# Patient Record
Sex: Female | Born: 1955 | Race: Black or African American | Hispanic: No | Smoking: Former smoker
Health system: Southern US, Community
[De-identification: ages and names within clinical notes are randomized; demographics above are authoritative.]

## PROBLEM LIST (undated history)

## (undated) DIAGNOSIS — Z87891 Personal history of nicotine dependence: Secondary | ICD-10-CM

## (undated) DIAGNOSIS — M1990 Unspecified osteoarthritis, unspecified site: Secondary | ICD-10-CM

## (undated) DIAGNOSIS — G4700 Insomnia, unspecified: Secondary | ICD-10-CM

## (undated) DIAGNOSIS — D72820 Lymphocytosis (symptomatic): Secondary | ICD-10-CM

## (undated) DIAGNOSIS — F329 Major depressive disorder, single episode, unspecified: Secondary | ICD-10-CM

## (undated) DIAGNOSIS — M170 Bilateral primary osteoarthritis of knee: Secondary | ICD-10-CM

## (undated) DIAGNOSIS — I350 Nonrheumatic aortic (valve) stenosis: Secondary | ICD-10-CM

## (undated) DIAGNOSIS — E782 Mixed hyperlipidemia: Secondary | ICD-10-CM

## (undated) DIAGNOSIS — M5136 Other intervertebral disc degeneration, lumbar region: Secondary | ICD-10-CM

## (undated) DIAGNOSIS — I700 Atherosclerosis of aorta: Secondary | ICD-10-CM

## (undated) DIAGNOSIS — R7309 Other abnormal glucose: Secondary | ICD-10-CM

## (undated) DIAGNOSIS — M545 Low back pain, unspecified: Secondary | ICD-10-CM

## (undated) DIAGNOSIS — F411 Generalized anxiety disorder: Secondary | ICD-10-CM

## (undated) DIAGNOSIS — G96198 Other disorders of meninges, not elsewhere classified: Secondary | ICD-10-CM

## (undated) DIAGNOSIS — M25552 Pain in left hip: Secondary | ICD-10-CM

## (undated) DIAGNOSIS — M51369 Other intervertebral disc degeneration, lumbar region without mention of lumbar back pain or lower extremity pain: Secondary | ICD-10-CM

## (undated) DIAGNOSIS — F32A Depression, unspecified: Secondary | ICD-10-CM

## (undated) DIAGNOSIS — R42 Dizziness and giddiness: Secondary | ICD-10-CM

## (undated) DIAGNOSIS — I351 Nonrheumatic aortic (valve) insufficiency: Secondary | ICD-10-CM

## (undated) DIAGNOSIS — J309 Allergic rhinitis, unspecified: Secondary | ICD-10-CM

## (undated) DIAGNOSIS — I10 Essential (primary) hypertension: Secondary | ICD-10-CM

## (undated) DIAGNOSIS — M25512 Pain in left shoulder: Secondary | ICD-10-CM

## (undated) HISTORY — DX: Atherosclerosis of aorta: I70.0

## (undated) HISTORY — DX: Other abnormal glucose: R73.09

## (undated) HISTORY — DX: Personal history of nicotine dependence: Z87.891

## (undated) HISTORY — DX: Other disorders of meninges, not elsewhere classified: G96.198

## (undated) HISTORY — DX: Nonrheumatic aortic (valve) stenosis: I35.0

## (undated) HISTORY — DX: Unspecified osteoarthritis, unspecified site: M19.90

## (undated) HISTORY — DX: Pain in left hip: M25.552

## (undated) HISTORY — DX: Pain in left shoulder: M25.512

## (undated) HISTORY — DX: Nonrheumatic aortic (valve) insufficiency: I35.1

## (undated) HISTORY — PX: ECTOPIC PREGNANCY SURGERY: SHX613

## (undated) HISTORY — DX: Generalized anxiety disorder: F41.1

## (undated) HISTORY — DX: Dizziness and giddiness: R42

## (undated) HISTORY — DX: Low back pain, unspecified: M54.50

## (undated) HISTORY — DX: Essential (primary) hypertension: I10

## (undated) HISTORY — DX: Lymphocytosis (symptomatic): D72.820

## (undated) HISTORY — DX: Bilateral primary osteoarthritis of knee: M17.0

## (undated) HISTORY — DX: Mixed hyperlipidemia: E78.2

## (undated) HISTORY — PX: ABDOMINAL HYSTERECTOMY: SUR658

## (undated) HISTORY — DX: Allergic rhinitis, unspecified: J30.9

## (undated) HISTORY — DX: Other intervertebral disc degeneration, lumbar region without mention of lumbar back pain or lower extremity pain: M51.369

## (undated) HISTORY — PX: TUBAL LIGATION: SHX77

## (undated) HISTORY — DX: Depression, unspecified: F32.A

## (undated) HISTORY — DX: Insomnia, unspecified: G47.00

## (undated) HISTORY — DX: Other intervertebral disc degeneration, lumbar region: M51.36

---
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
## 2019-11-07 ENCOUNTER — Ambulatory Visit: Payer: Self-pay | Admitting: Cardiology

## 2019-11-07 NOTE — Progress Notes (Deleted)
Cardiology Office Consult Note    Date:  11/07/2019   ID:  Amanda Mcguire, DOB 1956/07/01, MRN [id number]  PCP:  No primary care provider on file.  Cardiologist:  Fransico Him, MD   No chief complaint on file.   History of Present Illness:  Amanda Mcguire is a 64 y.o. female who is being seen today for the evaluation of abnormal EKG at the request of No ref. provider found.  This is a 64yo female with     No past medical history on file.  *** The histories are not reviewed yet. Please review them in the "History" navigator section and refresh this [place].  Current Medications: No outpatient medications have been marked as taking for the 11/07/19 encounter (Appointment) with Sueanne Margarita, MD.    Allergies:   Patient has no allergy information on record.   Social History   Socioeconomic History  . Marital status: Not on file    Spouse name: Not on file  . Number of children: Not on file  . Years of education: Not on file  . Highest education level: Not on file  Occupational History  . Not on file  Tobacco Use  . Smoking status: Not on file  Substance and Sexual Activity  . Alcohol use: Not on file  . Drug use: Not on file  . Sexual activity: Not on file  Other Topics Concern  . Not on file  Social History Narrative  . Not on file   Social Determinants of Health   Financial Resource Strain:   . Difficulty of Paying Living Expenses: Not on file  Food Insecurity:   . Worried About Charity fundraiser in the Last Year: Not on file  . Ran Out of Food in the Last Year: Not on file  Transportation Needs:   . Lack of Transportation (Medical): Not on file  . Lack of Transportation (Non-Medical): Not on file  Physical Activity:   . Days of Exercise per Week: Not on file  . Minutes of Exercise per Session: Not on file  Stress:   . Feeling of Stress : Not on file  Social Connections:   . Frequency of Communication with Friends and Family: Not on file  .  Frequency of Social Gatherings with Friends and Family: Not on file  . Attends Religious Services: Not on file  . Active Member of Clubs or Organizations: Not on file  . Attends Archivist Meetings: Not on file  . Marital Status: Not on file     Family History:  The patient's ***family history is not on file.   ROS:   Please see the history of present illness.    ROS All other systems reviewed and are negative.  No flowsheet data found.     PHYSICAL EXAM:   VS:  There were no vitals taken for this visit.   GEN: Well nourished, well developed, in no acute distress  HEENT: normal  Neck: no JVD, carotid bruits, or masses Cardiac: ***RRR; no murmurs, rubs, or gallops,no edema.  Intact distal pulses bilaterally.  Respiratory:  clear to auscultation bilaterally, normal work of breathing GI: soft, nontender, nondistended, + BS MS: no deformity or atrophy  Skin: warm and dry, no rash Neuro:  Alert and Oriented x 3, Strength and sensation are intact Psych: euthymic mood, full affect  Wt Readings from Last 3 Encounters:  No data found for Wt      Studies/Labs Reviewed:   EKG:  EKG is*** ordered today.  The ekg ordered today demonstrates ***  Recent Labs: No results found for requested labs within last 8760 hours.   Lipid Panel No results found for: CHOL, TRIG, HDL, CHOLHDL, VLDL, LDLCALC, LDLDIRECT  Additional studies/ records that were reviewed today include:  ***    ASSESSMENT:    No diagnosis found.   PLAN:  In order of problems listed above:  1. ***    Medication Adjustments/Labs and Tests Ordered: Current medicines are reviewed at length with the patient today.  Concerns regarding medicines are outlined above.  Medication changes, Labs and Tests ordered today are listed in the Patient Instructions below.  There are no Patient Instructions on file for this visit.   Signed, Armanda Magic, MD  11/07/2019 8:44 AM    [facility]  [facility] [address] [place], [place], [place]  [address] Phone: [phone]; Fax: [phone]

## 2019-11-11 NOTE — Progress Notes (Signed)
Cardiology Office Consult Note    Date:  11/12/2019   ID:  Amanda Mcguire, DOB 05/29/1956, MRN [id number]  PCP:  Dr. Kristine Linea Cardiologist:  Armanda Magic, MD (NEW)  Chief Complaint  Patient presents with  . New Patient (Initial Visit)    abnormal EKG    History of Present Illness:  Amanda Mcguire is a 64 y.o. female who is being seen today for the evaluation of abnormal EKG at the request of Dr. Kristine Linea  This is a delightful 63yo AAF with a strong fm hx of premature CAD with her mom having CAD with CABG in her 24's and her father had CHF.  Her maternal GF also had CAD as well as several cousins.  She has a hx of remote tobacco use but quit in 2002, HLD (statin intolerant and now on Repatha) and recently had a wellness exam done and EKG was abnormal with anterior T wave inversions.    She is now referred for further evaluation. She tells me that occasionally she will have some twinges of chest pain but they are nonexertional with no associated sx of Nausea, SOB or diaphoresis.  There is no radiation of the pain which is short lived and very sporadic.  She does admit to DOE recently but is not sure that it may not be related to the mask she now has to wear due to COVID.  She denies any PND, orthopnea or significant palpitations or syncope.  She has had some dizzy spells in the past and was told she has vagal tendencies.    Past Medical History:  Diagnosis Date  . Abnormal blood sugar   . Allergic rhinitis   . Arthralgia of left thigh   . Benign hypertension   . Cyst of spinal meninges   . Degeneration of lumbar intervertebral disc   . Depression   . Ex-smoker   . Generalized anxiety disorder   . Insomnia   . Insomnia   . Left shoulder pain   . Lumbar back pain   . Lymphocytosis   . Mixed hyperlipidemia   . Osteoarthritis   . Primary osteoarthritis of both knees   . Vertigo     Past Surgical History:  Procedure Laterality Date  . ABDOMINAL  HYSTERECTOMY    . ECTOPIC PREGNANCY SURGERY    . TUBAL LIGATION      Current Medications: Current Meds  Medication Sig  . ALPRAZolam (XANAX) 0.5 MG tablet Take 0.5 mg by mouth 2 (two) times daily.  Marland Kitchen aspirin EC 81 MG tablet Take 81 mg by mouth daily.  . Cholecalciferol (VITAMIN D3) 50 MCG (2000 UT) TABS Take by mouth daily.  . Cyanocobalamin (VITAMIN B 12 PO) Take 1,000 mcg by mouth daily.  . diclofenac Sodium (VOLTAREN) 1 % GEL Apply topically 2 (two) times daily as needed.  Marland Kitchen EPINEPHrine (EPIPEN 2-PAK IJ) Inject as directed as needed.  Marland Kitchen escitalopram (LEXAPRO) 10 MG tablet Take 10 mg by mouth daily.  . Evolocumab (REPATHA SURECLICK) 140 MG/ML SOAJ Inject into the skin. EVERY 2 WEEKS  . hydrochlorothiazide (MICROZIDE) 12.5 MG capsule Take 12.5 mg by mouth daily.  . metoprolol succinate (TOPROL-XL) 25 MG 24 hr tablet Take 25 mg by mouth daily.  Marland Kitchen NIFEdipine (ADALAT CC) 30 MG 24 hr tablet Take 30 mg by mouth daily.  Marland Kitchen oxybutynin (DITROPAN-XL) 10 MG 24 hr tablet Take 10 mg by mouth daily.  . piroxicam (FELDENE) 20 MG capsule Take 20 mg by mouth daily.  Marland Kitchen  tiZANidine (ZANAFLEX) 4 MG tablet Take 4 mg by mouth 3 (three) times daily as needed for muscle spasms.  . traMADol (ULTRAM) 50 MG tablet Take by mouth every 6 (six) hours as needed.  . TURMERIC PO Take 500 mg by mouth daily.    Allergies:   Other, Penicillins, Sulfa antibiotics, and Codeine   Social History   Socioeconomic History  . Marital status: Not on file    Spouse name: Not on file  . Number of children: Not on file  . Years of education: Not on file  . Highest education level: Not on file  Occupational History  . Not on file  Tobacco Use  . Smoking status: Former Smoker    Quit date: 11/10/2000    Years since quitting: 19.0  . Smokeless tobacco: Never Used  Substance and Sexual Activity  . Alcohol use: Never  . Drug use: Not on file  . Sexual activity: Not on file    Comment: MARRIED  Other Topics Concern  . Not  on file  Social History Narrative  . Not on file   Social Determinants of Health   Financial Resource Strain:   . Difficulty of Paying Living Expenses: Not on file  Food Insecurity:   . Worried About Charity fundraiser in the Last Year: Not on file  . Ran Out of Food in the Last Year: Not on file  Transportation Needs:   . Lack of Transportation (Medical): Not on file  . Lack of Transportation (Non-Medical): Not on file  Physical Activity:   . Days of Exercise per Week: Not on file  . Minutes of Exercise per Session: Not on file  Stress:   . Feeling of Stress : Not on file  Social Connections:   . Frequency of Communication with Friends and Family: Not on file  . Frequency of Social Gatherings with Friends and Family: Not on file  . Attends Religious Services: Not on file  . Active Member of Clubs or Organizations: Not on file  . Attends Archivist Meetings: Not on file  . Marital Status: Not on file     Family History:  The patient's family history includes CAD in her maternal grandfather and mother; Heart failure in her father; Transient ischemic attack (age of onset: 42) in her mother.   ROS:   Please see the history of present illness.    ROS All other systems reviewed and are negative.  No flowsheet data found.     PHYSICAL EXAM:   VS:  BP 130/82   Pulse 72   Ht 5\' 4"  (1.626 m)   Wt 178 lb 12.8 oz (81.1 kg)   BMI 30.69 kg/m    GEN: Well nourished, well developed, in no acute distress  HEENT: normal  Neck: no JVD, carotid bruits, or masses Cardiac: RRR; no murmurs, rubs, or gallops,no edema.  Intact distal pulses bilaterally.  Respiratory:  clear to auscultation bilaterally, normal work of breathing GI: soft, nontender, nondistended, + BS MS: no deformity or atrophy  Skin: warm and dry, no rash Neuro:  Alert and Oriented x 3, Strength and sensation are intact Psych: euthymic mood, full affect  Wt Readings from Last 3 Encounters:  11/12/19 178  lb 12.8 oz (81.1 kg)      Studies/Labs Reviewed:   EKG:  EKG is ordered today.  The ekg ordered today demonstrates NSR with nonspecific T wave abnormality  Recent Labs: No results found for requested labs within last  8760 hours.   Lipid Panel No results found for: CHOL, TRIG, HDL, CHOLHDL, VLDL, LDLCALC, LDLDIRECT  Additional studies/ records that were reviewed today include:  Office notes from PCP, labs, EKG    ASSESSMENT:    1. Nonspecific abnormal electrocardiogram (ECG) (EKG)   2. Shortness of breath   3. Pure hypercholesterolemia      PLAN:  In order of problems listed above:  1. Abnormal EKG  -EKG shows nonspecific T wave abnormality today but on EKG in PCP office there were definite T wave inversions but HR was faster at that time -she has had some vague chest discomfort that sounds atypical -she also has had SOB which she attributes to having to wear a facemask -she has CRfs including remote tobacco use, HLD and strong fm hx of premature CAD -recommend coronary CTA to assess for CAD  2.  DOE -she tells me that she was told in the past that she had some heart damage on the left side of her heart but I have no record of this -check 2D echo to assess LVF  3.  HLD -she is statin intolerant -now on Repatha    Medication Adjustments/Labs and Tests Ordered: Current medicines are reviewed at length with the patient today.  Concerns regarding medicines are outlined above.  Medication changes, Labs and Tests ordered today are listed in the Patient Instructions below.  Patient Instructions  Medication Instructions:  Your physician recommends that you continue on your current medications as directed. Please refer to the Current Medication list given to you today.  *If you need a refill on your cardiac medications before your next appointment, please call your pharmacy*  Lab Work: BMET prior to your CT scan   If you have labs (blood work) drawn today and your  tests are completely normal, you will receive your results only by: Marland Kitchen MyChart Message (if you have MyChart) OR . A paper copy in the mail If you have any lab test that is abnormal or we need to change your treatment, we will call you to review the results.  Testing/Procedures: Your physician has requested that you have an echocardiogram. Echocardiography is a painless test that uses sound waves to create images of your heart. It provides your doctor with information about the size and shape of your heart and how well your heart's chambers and valves are working. This procedure takes approximately one hour. There are no restrictions for this procedure.  Your physician has requested that you have cardiac CT. Cardiac computed tomography (CT) is a painless test that uses an x-ray machine to take clear, detailed pictures of your heart. For further information please visit [email or web address] Please follow instruction sheet as given.  Follow-Up: At [facility], you and your health needs are our priority.  As part of our continuing mission to provide you with exceptional heart care, we have created designated Provider Care Teams.  These Care Teams include your primary Cardiologist (physician) and Advanced Practice Providers (APPs -  Physician Assistants and Nurse Practitioners) who all work together to provide you with the care you need, when you need it.  Follow up with Dr. Mayford Knife as needed based on results of testing.    Your cardiac CT will be scheduled at one of the below locations:   [facility] [address] [place], [place] [address] [phone]  OR  [facility] [address] [place], [place] [address] [phone]  If scheduled at [facility], please arrive at the [facility] main entrance of [facility] 30-45 minutes prior to test start time. Proceed to the [facility] Radiology Department (first floor)  to check-in and test prep.  If scheduled at [facility], please arrive 15 mins early for check-in and test prep.  Please follow these instructions carefully (unless otherwise directed):  On the Night Before the Test: . Be sure to Drink plenty of water. . Do not consume any caffeinated/decaffeinated beverages or chocolate 12 hours prior to your test. . Do not take any antihistamines 12 hours prior to your test.  On the Day of the Test: . Drink plenty of water. Do not drink any water within one hour of the test. . Do not eat any food 4 hours prior to the test. . You may take your regular medications prior to the test.  . Take metoprolol (Lopressor) two hours prior to test. . HOLD Hydrochlorothiazide morning of the test. . FEMALES- please wear underwire-free bra if available  After the Test: . Drink plenty of water. . After receiving IV contrast, you may experience a mild flushed feeling. This is normal. . On occasion, you may experience a mild rash up to 24 hours after the test. This is not dangerous. If this occurs, you can take Benadryl 25 mg and increase your fluid intake. . If you experience trouble breathing, this can be serious. If it is severe call 911 IMMEDIATELY. If it is mild, please call our office. . If you take any of these medications: Glipizide/Metformin, Avandament, Glucavance, please do not take 48 hours after completing test unless otherwise instructed.   Once we have confirmed authorization from your insurance company, we will call you to set up a date and time for your test.   For non-scheduling related questions, please contact the cardiac imaging nurse navigator should you have any questions/concerns: Rockwell Alexandria, RN Navigator Cardiac Imaging [facility] [phone] Office       Signed, Armanda Magic, MD  11/12/2019 3:13 PM    [facility] [address] [place], [place], [place]   [address] Phone: [phone]; Fax: [phone]

## 2019-11-12 ENCOUNTER — Other Ambulatory Visit: Payer: Self-pay

## 2019-11-12 ENCOUNTER — Ambulatory Visit: Payer: Medicare PPO | Admitting: Cardiology

## 2019-11-12 ENCOUNTER — Encounter: Payer: Self-pay | Admitting: Cardiology

## 2019-11-12 VITALS — BP 130/82 | HR 72 | Ht 64.0 in | Wt 178.8 lb

## 2019-11-12 DIAGNOSIS — E7800 Pure hypercholesterolemia, unspecified: Secondary | ICD-10-CM | POA: Diagnosis not present

## 2019-11-12 DIAGNOSIS — R9431 Abnormal electrocardiogram [ECG] [EKG]: Secondary | ICD-10-CM | POA: Diagnosis not present

## 2019-11-12 DIAGNOSIS — R0602 Shortness of breath: Secondary | ICD-10-CM

## 2019-11-12 MED ORDER — METOPROLOL TARTRATE 100 MG PO TABS: 100.0000 mg | ORAL_TABLET | Freq: Two times a day (BID) | ORAL | 0 refills | Status: AC

## 2019-11-12 NOTE — Patient Instructions (Addendum)
Medication Instructions:  Your physician recommends that you continue on your current medications as directed. Please refer to the Current Medication list given to you today.  *If you need a refill on your cardiac medications before your next appointment, please call your pharmacy*  Lab Work: BMET prior to your CT scan   If you have labs (blood work) drawn today and your tests are completely normal, you will receive your results only by: Marland Kitchen MyChart Message (if you have MyChart) OR . A paper copy in the mail If you have any lab test that is abnormal or we need to change your treatment, we will call you to review the results.  Testing/Procedures: Your physician has requested that you have an echocardiogram. Echocardiography is a painless test that uses sound waves to create images of your heart. It provides your doctor with information about the size and shape of your heart and how well your heart's chambers and valves are working. This procedure takes approximately one hour. There are no restrictions for this procedure.  Your physician has requested that you have cardiac CT. Cardiac computed tomography (CT) is a painless test that uses an x-ray machine to take clear, detailed pictures of your heart. For further information please visit [email or web address] Please follow instruction sheet as given.  Follow-Up: At [facility], you and your health needs are our priority.  As part of our continuing mission to provide you with exceptional heart care, we have created designated Provider Care Teams.  These Care Teams include your primary Cardiologist (physician) and Advanced Practice Providers (APPs -  Physician Assistants and Nurse Practitioners) who all work together to provide you with the care you need, when you need it.  Follow up with Dr. Mayford Knife as needed based on results of testing.    Your cardiac CT will be scheduled at one of the below locations:   [facility] [address] [place], [place] [address] [phone]  OR  [facility] [address] [place], [place] [address] [phone]  If scheduled at [facility], please arrive at the [facility] main entrance of [facility] 30-45 minutes prior to test start time. Proceed to the [facility] Radiology Department (first floor) to check-in and test prep.  If scheduled at [facility], please arrive 15 mins early for check-in and test prep.  Please follow these instructions carefully (unless otherwise directed):  On the Night Before the Test: . Be sure to Drink plenty of water. . Do not consume any caffeinated/decaffeinated beverages or chocolate 12 hours prior to your test. . Do not take any antihistamines 12 hours prior to your test.  On the Day of the Test: . Drink plenty of water. Do not drink any water within one hour of the test. . Do not eat any food 4 hours prior to the test. . You may take your regular medications prior to the test.  . Take metoprolol (Lopressor) two hours prior to test. . HOLD Hydrochlorothiazide morning of the test. . FEMALES- please wear underwire-free bra if available  After the Test: . Drink plenty of water. . After receiving IV contrast, you may experience a mild flushed feeling. This is normal. . On occasion, you may experience a mild rash up to 24 hours after the test. This is not dangerous. If this occurs, you can take Benadryl 25 mg and increase your fluid intake. . If you experience trouble breathing, this can be  serious. If it is severe call 911 IMMEDIATELY. If it is mild, please call our office. . If you take any of these medications: Glipizide/Metformin, Avandament, Glucavance, please do not take 48 hours after completing test unless otherwise instructed.   Once we have confirmed authorization from your insurance company, we will call you to set up a date and time for your  test.   For non-scheduling related questions, please contact the cardiac imaging nurse navigator should you have any questions/concerns: Marchia Bond, RN Navigator Cardiac Imaging [facility] [phone] Office

## 2019-11-19 ENCOUNTER — Telehealth: Payer: Self-pay

## 2019-11-19 DIAGNOSIS — E7800 Pure hypercholesterolemia, unspecified: Secondary | ICD-10-CM

## 2019-11-19 NOTE — Telephone Encounter (Signed)
Patient will have FLP and ALT drawn when she comes in for BMET and echocardiogram on 11/25/19

## 2019-11-19 NOTE — Telephone Encounter (Signed)
-----   Message from Quintella Reichert, MD sent at 11/19/2019  1:28 PM EST ----- LDL was 143 in Nov 2020 - no on Repatha - please repeat FLP and ALT

## 2019-11-25 ENCOUNTER — Encounter: Payer: Self-pay | Admitting: Cardiology

## 2019-11-25 ENCOUNTER — Ambulatory Visit (HOSPITAL_COMMUNITY): Payer: Medicare PPO | Attending: Internal Medicine

## 2019-11-25 ENCOUNTER — Other Ambulatory Visit: Payer: Medicare PPO

## 2019-11-25 ENCOUNTER — Other Ambulatory Visit: Payer: Self-pay

## 2019-11-25 DIAGNOSIS — R9431 Abnormal electrocardiogram [ECG] [EKG]: Secondary | ICD-10-CM | POA: Diagnosis not present

## 2019-11-25 DIAGNOSIS — E7800 Pure hypercholesterolemia, unspecified: Secondary | ICD-10-CM

## 2019-11-25 DIAGNOSIS — I350 Nonrheumatic aortic (valve) stenosis: Secondary | ICD-10-CM

## 2019-11-25 DIAGNOSIS — I351 Nonrheumatic aortic (valve) insufficiency: Secondary | ICD-10-CM | POA: Insufficient documentation

## 2019-11-25 DIAGNOSIS — R0602 Shortness of breath: Secondary | ICD-10-CM

## 2019-11-25 LAB — LIPID PANEL
Chol/HDL Ratio: 2.3 ratio (ref 0.0–4.4)
Cholesterol, Total: 169 mg/dL (ref 100–199)
HDL: 74 mg/dL (ref >39)
LDL Chol Calc (NIH): 79 mg/dL (ref 0–99)
Triglycerides: 89 mg/dL (ref 0–149)
VLDL Cholesterol Cal: 16 mg/dL (ref 5–40)

## 2019-11-25 LAB — BASIC METABOLIC PANEL
BUN/Creatinine Ratio: 14 (ref 12–28)
BUN: 12 mg/dL (ref 8–27)
CO2: 23 mmol/L (ref 20–29)
Calcium: 10.1 mg/dL (ref 8.7–10.3)
Chloride: 106 mmol/L (ref 96–106)
Creatinine, Ser: 0.83 mg/dL (ref 0.57–1.00)
GFR calc Af Amer: 86 mL/min/{1.73_m2} (ref >59)
GFR calc non Af Amer: 74 mL/min/{1.73_m2} (ref >59)
Glucose: 99 mg/dL (ref 65–99)
Potassium: 4.5 mmol/L (ref 3.5–5.2)
Sodium: 144 mmol/L (ref 134–144)

## 2019-11-25 LAB — ALT: ALT: 11 IU/L (ref 0–32)

## 2019-11-26 MED ORDER — EZETIMIBE 10 MG PO TABS: 10.0000 mg | ORAL_TABLET | Freq: Every day | ORAL | 3 refills | Status: AC

## 2019-12-15 ENCOUNTER — Ambulatory Visit (HOSPITAL_COMMUNITY): Payer: Medicare PPO

## 2019-12-22 ENCOUNTER — Encounter: Payer: Self-pay | Admitting: Cardiology

## 2019-12-22 ENCOUNTER — Other Ambulatory Visit: Payer: Self-pay

## 2019-12-22 ENCOUNTER — Ambulatory Visit (HOSPITAL_COMMUNITY)
Admission: RE | Admit: 2019-12-22 | Discharge: 2019-12-22 | Disposition: A | Discharge status: Home / Self Care | Payer: Medicare PPO | Source: Ambulatory Visit | Attending: Cardiology | Admitting: Cardiology

## 2019-12-22 DIAGNOSIS — R079 Chest pain, unspecified: Secondary | ICD-10-CM | POA: Insufficient documentation

## 2019-12-22 DIAGNOSIS — R0602 Shortness of breath: Secondary | ICD-10-CM | POA: Diagnosis present

## 2019-12-22 DIAGNOSIS — R9431 Abnormal electrocardiogram [ECG] [EKG]: Secondary | ICD-10-CM | POA: Diagnosis not present

## 2019-12-22 DIAGNOSIS — I700 Atherosclerosis of aorta: Secondary | ICD-10-CM | POA: Insufficient documentation

## 2019-12-22 MED ORDER — IOHEXOL 350 MG/ML SOLN
100.0000 mL | Freq: Once | INTRAVENOUS | Status: AC | PRN
  Administered 2019-12-22: 100 mL via INTRAVENOUS

## 2019-12-22 MED ORDER — NITROGLYCERIN 0.4 MG SL SUBL
0.8000 mg | SUBLINGUAL_TABLET | SUBLINGUAL | Status: DC | PRN
  Administered 2019-12-22: 0.8 mg via SUBLINGUAL

## 2019-12-22 MED ORDER — METOPROLOL TARTRATE 5 MG/5ML IV SOLN: 5.0000 mg | INTRAVENOUS | Status: DC | PRN

## 2019-12-22 MED ORDER — NITROGLYCERIN 0.4 MG SL SUBL
SUBLINGUAL_TABLET | SUBLINGUAL | Status: AC
  Filled 2019-12-22: qty 2

## 2019-12-22 MED ORDER — METOPROLOL TARTRATE 5 MG/5ML IV SOLN
INTRAVENOUS | Status: AC
  Administered 2019-12-22: 5 mg via INTRAVENOUS
  Filled 2019-12-22: qty 15

## 2019-12-22 NOTE — Progress Notes (Signed)
Pt has slight headache post CTA. Pt provided coffee and cookies. IV removed.

## 2020-11-23 ENCOUNTER — Ambulatory Visit (HOSPITAL_COMMUNITY): Payer: Medicare PPO | Attending: Internal Medicine

## 2020-11-23 ENCOUNTER — Other Ambulatory Visit: Payer: Self-pay

## 2020-11-23 DIAGNOSIS — I350 Nonrheumatic aortic (valve) stenosis: Secondary | ICD-10-CM | POA: Diagnosis not present

## 2020-11-23 DIAGNOSIS — I351 Nonrheumatic aortic (valve) insufficiency: Secondary | ICD-10-CM | POA: Diagnosis not present

## 2020-11-23 LAB — ECHOCARDIOGRAM COMPLETE
AR max vel: 1.11 cm2
AV Area VTI: 1.48 cm2
AV Area mean vel: 1.38 cm2
AV Mean grad: 8 mmHg
AV Peak grad: 19.4 mmHg
Ao pk vel: 2.2 m/s
Area-P 1/2: 3.83 cm2
P 1/2 time: 307 msec
S' Lateral: 3 cm

## 2020-11-24 ENCOUNTER — Other Ambulatory Visit (HOSPITAL_COMMUNITY): Payer: Medicare PPO

## 2020-11-30 ENCOUNTER — Encounter: Payer: Self-pay | Admitting: Cardiology

## 2021-03-02 IMAGING — CT CT HEART MORP W/ CTA COR W/ SCORE W/ CA W/CM &/OR W/O CM
2 of 8 series · 4 of 20 positions shown, 5 images · IV contrast (APPLIED)
Comparison: None.
COMPARISON: None.

Addendum:
EXAM:
OVER-READ INTERPRETATION  CT CHEST

The following report is an over-read performed by radiologist Dr.
Nazareth Jumper [[email or web address]] on 12/22/2019. This
over-read does not include interpretation of cardiac or coronary
anatomy or pathology. The coronary calcium score/coronary CTA
interpretation by the cardiologist is attached.
TECHNIQUE: The patient was scanned on a [employer] scanner.

[Series 10: ts syst sharp 44 % · axial · 0.39mm/px · z∈[+66,+109]mm · 2 of 321 slices shown]
[im 107/321  lung]
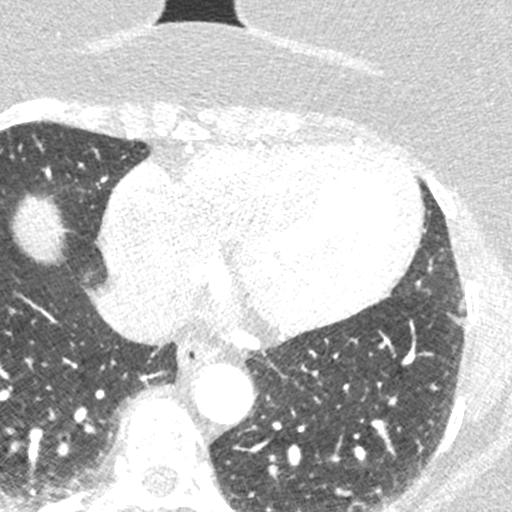
[im 214/321  lung]
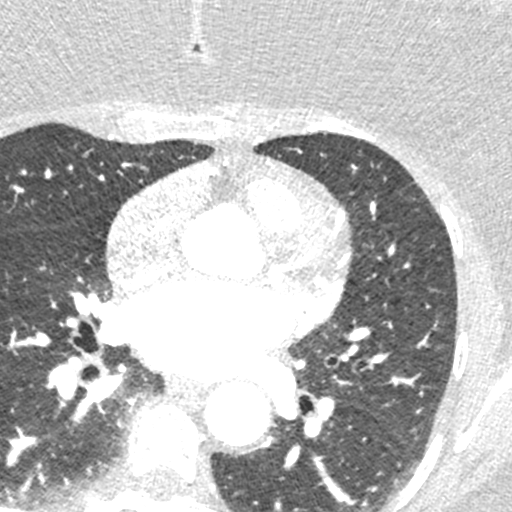

[Series 14: best syst manual · axial · 0.39mm/px · z∈[+66,+109]mm · 2 of 321 slices shown, 3 images]
[im 107/321  vessel]
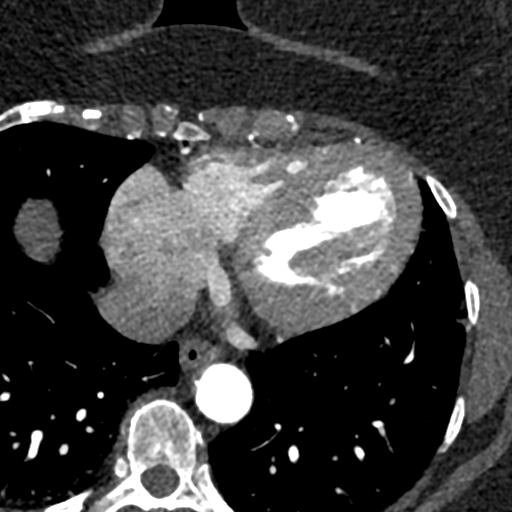
[im 107/321  lung]
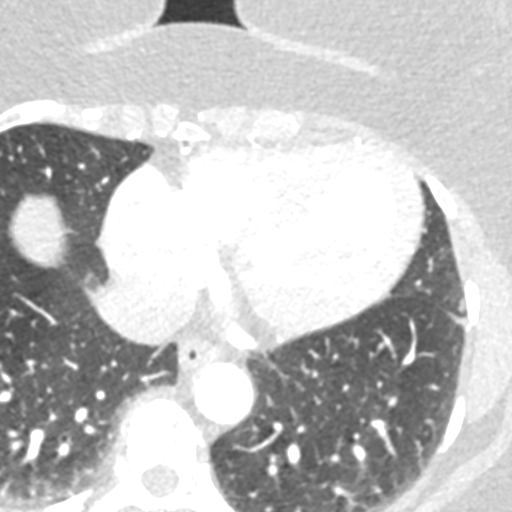
[im 214/321  vessel]
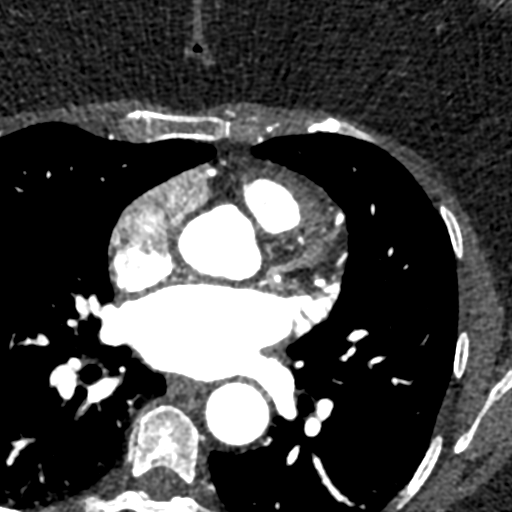

[4 of 20 positions shown; findings below may reference images not displayed]

FINDINGS: Aortic atherosclerosis. Within the visualized portions of the thorax
there are no suspicious appearing pulmonary nodules or masses, there
is no acute consolidative airspace disease, no pleural effusions, no
pneumothorax and no lymphadenopathy. Visualized portions of the
upper abdomen are unremarkable. There are no aggressive appearing
lytic or blastic lesions noted in the visualized portions of the
skeleton.
IMPRESSION: 1.  Aortic Atherosclerosis (N6Y7T-[id number].Q).

EXAM:
Cardiac/Coronary  CT
FINDINGS: A 120 kV prospective scan was triggered in the descending thoracic
aorta at 111 HU's. Axial non-contrast 3 mm slices were carried out
through the heart. The data set was analyzed on a dedicated work
station and scored using the Agatson method. Gantry rotation speed
was 250 msecs and collimation was .6 mm. No beta blockade and 0.8 mg
of sl NTG was given. The 3D data set was reconstructed in 5%
intervals of the 67-82 % of the R-R cycle. Diastolic phases were
analyzed on a dedicated work station using MPR, MIP and VRT modes.
The patient received 80 cc of contrast.

Aorta: Normal size. Scattered calcifications in the descending
aorta. No dissection.

Aortic Valve:  Trileaflet.  No calcifications.

Coronary Arteries:  Normal coronary origin.  Right dominance.

RCA is a large dominant artery that gives rise to PDA and PLVB.
There is no plaque.

Left main is a large artery that gives rise to LAD and LCX arteries.
There is no plaque.

LAD is a large vessel that has no plaque.

LCX is a non-dominant artery that gives rise to a moderate sized OM1
and large branching OM2. There is no plaque in the LCx. The proximal
and mid OM2 is not well visualized due to motion artifact but no
obvious plaque is present.

Other findings:

Normal pulmonary vein drainage into the left atrium.

Normal let atrial appendage without a thrombus.

Normal size of the pulmonary artery.
IMPRESSION: 1. Coronary calcium score of 0. This was 0 percentile for age and
sex matched control.

2. Normal coronary origin with right dominance.

3. No evidence of CAD. CAD-RADS 0. The proximal and mid OM2 are not
well visualized to due noise and motion artifact.

4.  Consider non atherosclerotic causes of chest pain.

Nala Tiger

*** End of Addendum ***
EXAM:
OVER-READ INTERPRETATION  CT CHEST

The following report is an over-read performed by radiologist Dr.
Nazareth Jumper [[email or web address]] on 12/22/2019. This
over-read does not include interpretation of cardiac or coronary
anatomy or pathology. The coronary calcium score/coronary CTA
interpretation by the cardiologist is attached.
FINDINGS: Aortic atherosclerosis. Within the visualized portions of the thorax
there are no suspicious appearing pulmonary nodules or masses, there
is no acute consolidative airspace disease, no pleural effusions, no
pneumothorax and no lymphadenopathy. Visualized portions of the
upper abdomen are unremarkable. There are no aggressive appearing
lytic or blastic lesions noted in the visualized portions of the
skeleton.
IMPRESSION: 1.  Aortic Atherosclerosis (N6Y7T-K5Q.Q).

## 2024-11-07 ENCOUNTER — Emergency Department (HOSPITAL_COMMUNITY)
Admission: EM | Admit: 2024-11-07 | Discharge: 2024-11-07 | Disposition: A | Discharge status: Home / Self Care | Source: Ambulatory Visit | Attending: Emergency Medicine | Admitting: Emergency Medicine

## 2024-11-07 ENCOUNTER — Encounter (HOSPITAL_COMMUNITY): Payer: Self-pay

## 2024-11-07 DIAGNOSIS — I10 Essential (primary) hypertension: Secondary | ICD-10-CM | POA: Insufficient documentation

## 2024-11-07 DIAGNOSIS — R000 Tachycardia, unspecified: Secondary | ICD-10-CM | POA: Insufficient documentation

## 2024-11-07 DIAGNOSIS — R040 Epistaxis: Secondary | ICD-10-CM | POA: Insufficient documentation

## 2024-11-07 DIAGNOSIS — Z7982 Long term (current) use of aspirin: Secondary | ICD-10-CM | POA: Insufficient documentation

## 2024-11-07 DIAGNOSIS — Z79899 Other long term (current) drug therapy: Secondary | ICD-10-CM | POA: Diagnosis not present

## 2024-11-07 LAB — BASIC METABOLIC PANEL WITH GFR
Anion gap: 13 (ref 5–15)
BUN: 23 mg/dL (ref 8–23)
CO2: 24 mmol/L (ref 22–32)
Calcium: 9.9 mg/dL (ref 8.9–10.3)
Chloride: 109 mmol/L (ref 98–111)
Creatinine, Ser: 0.8 mg/dL (ref 0.44–1.00)
GFR, Estimated: >60 mL/min
Glucose, Bld: 123 mg/dL — ABNORMAL HIGH (ref 70–99)
Potassium: 3.7 mmol/L (ref 3.5–5.1)
Sodium: 145 mmol/L (ref 135–145)

## 2024-11-07 LAB — CBC
HCT: 40.9 % (ref 36.0–46.0)
Hemoglobin: 13.2 g/dL (ref 12.0–15.0)
MCH: 28.8 pg (ref 26.0–34.0)
MCHC: 32.3 g/dL (ref 30.0–36.0)
MCV: 89.3 fL (ref 80.0–100.0)
Platelets: 308 K/uL (ref 150–400)
RBC: 4.58 MIL/uL (ref 3.87–5.11)
RDW: 16.9 % — ABNORMAL HIGH (ref 11.5–15.5)
WBC: 8.9 K/uL (ref 4.0–10.5)
nRBC: 0 % (ref 0.0–0.2)

## 2024-11-07 MED ORDER — SALINE SPRAY 0.65 % NA SOLN: 1.0000 | NASAL | 0 refills | Status: AC | PRN

## 2024-11-07 MED ORDER — ONDANSETRON HCL 4 MG/2ML IJ SOLN
4.0000 mg | Freq: Once | INTRAMUSCULAR | Status: AC
  Administered 2024-11-07: 4 mg via INTRAVENOUS
  Filled 2024-11-07: qty 2

## 2024-11-07 MED ORDER — OXYMETAZOLINE HCL 0.05 % NA SOLN
1.0000 | Freq: Once | NASAL | Status: AC
  Administered 2024-11-07: 1 via NASAL

## 2024-11-07 NOTE — ED Triage Notes (Signed)
 Pt presents with c/o epistaxis that started last night. Pt reports her nose began bleeding last night and then stopped after about 20 minutes and then it began again in the middle of the night. Pt reports she was also vomiting up the blood as it was draining down the back of her throat. Pt reports she went to UC this morning and was told to come to the ER. Pt does not have any bleeding at this time.

## 2024-11-07 NOTE — ED Notes (Addendum)
 Pt alert, NAD, calm, interactive, resps e/u, speaking in clear complete sentences. Takes zyrtec daily. Has not had her atenolol today. HR 120 at this time. Reports dry environment. Mentions recent nosebleed from both nares. Denies active bleeding at this time. Scan blood noted in nares and oropharynx. Afrin and nose clamp applied. EDP into room. Denies sob, pain, or dizziness. Nausea continues.

## 2024-11-07 NOTE — Discharge Instructions (Signed)
 You were seen in the emergency department for your nosebleed, you had no recurrence of bleeding in the emergency department.  Your workup showed no signs of anemia or dehydration.  Your heart rate was slightly elevated here and you should take your atenolol when you get home.  If you have recurrence of bleeding at home, first blow out all the blood clots from your nose, then you can spray 2 sprays of Afrin in each nostril and then hold pressure as hard as you can on the soft part of your nose for at least 15 minutes with your head tilted forward to prevent you from swallowing any of the blood.  If you are continuing to have bleeding you should return to the emergency department.  To help prevent bleeding, you should get a humidifier to sleep with at night to help keep the air moist and you can also do saline nasal spray morning and night and Vaseline in your nostrils.  You can follow-up with your primary doctor to have your symptoms rechecked.  You can return to the emergency department if having uncontrollable bleeding or any other new or concerning symptoms.

## 2024-11-07 NOTE — ED Notes (Signed)
 Resting, NAD, calm, no bleeding noted. HR 106.

## 2024-11-07 NOTE — ED Provider Notes (Signed)
 " [place] [facility] AT [facility] Provider Note   CSN: [id number] Arrival date & time: 11/07/24  0830     Patient presents with: Epistaxis   Amanda Mcguire is a 69 y.o. female.   Patient is a 69 year old female with a past medical history of hypertension, PVD presenting to the emergency department with nosebleed.  The patient states around 6:30 PM last night she developed a spontaneous nosebleed.  She states that she held pressure and it stopped after about 20 minutes.  She states that she then woke up around 2 AM this morning with recurrence of the bleed.  She states that she was unable to control it at home and she went to urgent care who recommended she come to the ED for evaluation.  She states that she did feel like she swallowed a decent amount of blood and did have nausea and vomited once this morning that did appear to be hematemesis.  She denies any lightheadedness or dizziness.  She is on aspirin but denies any other blood thinner use.  She denies any nasal trauma.  The history is provided by the patient.  Epistaxis      Prior to Admission medications  Medication Sig Start Date End Date Taking? Authorizing Provider  sodium chloride (OCEAN) 0.65 % SOLN nasal spray Place 1 spray into both nostrils as needed for congestion. 11/07/24  Yes Ellouise, Vanetta Rule K, DO  ALPRAZolam (XANAX) 0.5 MG tablet Take 0.5 mg by mouth 2 (two) times daily.    [provider]  aspirin EC 81 MG tablet Take 81 mg by mouth daily.    [provider]  Cholecalciferol (VITAMIN D3) 50 MCG (2000 UT) TABS Take by mouth daily.    [provider]  Cyanocobalamin (VITAMIN B 12 PO) Take 1,000 mcg by mouth daily.    [provider]  diclofenac Sodium (VOLTAREN) 1 % GEL Apply topically 2 (two) times daily as needed.    [provider]  EPINEPHrine (EPIPEN 2-PAK IJ) Inject as directed as needed.    [provider]  escitalopram  (LEXAPRO) 10 MG tablet Take 10 mg by mouth daily.    [provider]  Evolocumab (REPATHA SURECLICK) 140 MG/ML SOAJ Inject into the skin. EVERY 2 WEEKS    [provider]  ezetimibe  (ZETIA ) 10 MG tablet Take 1 tablet (10 mg total) by mouth daily. 11/26/19   Shlomo Wilbert SAUNDERS, MD  hydrochlorothiazide (MICROZIDE) 12.5 MG capsule Take 12.5 mg by mouth daily.    [provider]  metoprolol  succinate (TOPROL -XL) 25 MG 24 hr tablet Take 25 mg by mouth daily. 10/27/19   [provider]  metoprolol  tartrate (LOPRESSOR ) 100 MG tablet Take 1 tablet (100 mg total) by mouth 2 (two) times daily. Take one tablet two hours prior to CT scan 11/12/19   Shlomo Wilbert SAUNDERS, MD  NIFEdipine (ADALAT CC) 30 MG 24 hr tablet Take 30 mg by mouth daily.    [provider]  oxybutynin (DITROPAN-XL) 10 MG 24 hr tablet Take 10 mg by mouth daily. 10/27/19   [provider]  piroxicam (FELDENE) 20 MG capsule Take 20 mg by mouth daily.    [provider]  tiZANidine (ZANAFLEX) 4 MG tablet Take 4 mg by mouth 3 (three) times daily as needed for muscle spasms.    [provider]  traMADol (ULTRAM) 50 MG tablet Take by mouth every 6 (six) hours as needed.    [provider]  TURMERIC PO Take 500 mg by mouth daily.    [provider]    Allergies: Other, Penicillins, Sulfa antibiotics, and Codeine    Review of Systems  HENT:  Positive for nosebleeds.     Updated Vital Signs BP (!) 142/73   Pulse 100   Temp 97.7 F (36.5 C) (Oral)   Resp 19   SpO2 96%   Physical Exam Vitals and nursing note reviewed.  Constitutional:      General: She is not in acute distress.    Appearance: Normal appearance.  HENT:     Head: Normocephalic and atraumatic.     Nose:     Comments: Small amount of dried blood in left nare, no active bleeding bilaterally    Mouth/Throat:     Mouth: Mucous membranes are moist.     Pharynx: Oropharynx is clear.     Comments:  No blood in posterior oropharynx Eyes:     Extraocular Movements: Extraocular movements intact.     Conjunctiva/sclera: Conjunctivae normal.  Cardiovascular:     Rate and Rhythm: Regular rhythm. Tachycardia present.     Heart sounds: Normal heart sounds.  Pulmonary:     Effort: Pulmonary effort is normal.     Breath sounds: Normal breath sounds.  Abdominal:     General: Abdomen is flat.     Tenderness: There is no abdominal tenderness.  Musculoskeletal:        General: Normal range of motion.     Cervical back: Normal range of motion.  Skin:    General: Skin is warm and dry.  Neurological:     General: No focal deficit present.     Mental Status: She is alert and oriented to person, place, and time.  Psychiatric:        Mood and Affect: Mood normal.        Behavior: Behavior normal.     (all labs ordered are listed, but only abnormal results are displayed) Labs Reviewed  CBC - Abnormal; Notable for the following components:      Result Value   RDW 16.9 (*)    All other components within normal limits  BASIC METABOLIC PANEL WITH GFR - Abnormal; Notable for the following components:   Glucose, Bld 123 (*)    All other components within normal limits    EKG: EKG Interpretation Date/Time:  Friday November 07 2024 09:24:40 EST Ventricular Rate:  99 PR Interval:  167 QRS Duration:  69 QT Interval:  316 QTC Calculation: 406 R Axis:   45  Text Interpretation: Sinus rhythm Biatrial enlargement No previous ECGs available Confirmed by Ellouise Fine ([id number]) on 11/07/2024 9:42:35 AM  Radiology: No results found.   Procedures   Medications Ordered in the ED  oxymetazoline  (AFRIN) 0.05 % nasal spray 1 spray (1 spray Each Nare Given 11/07/24 0850)  ondansetron  (ZOFRAN ) injection 4 mg (4 mg Intravenous Given 11/07/24 0922)    Clinical Course as of 11/07/24 1111  Fri Nov 07, 2024  1033 Labs within normal range, NSR on EKG with HR improving. No return of bleeding. Patient  is stable for discharge home with outpatient follow up. [VK]    Clinical Course User Index [VK] Kingsley, Yuvraj Pfeifer K, DO                                 Medical Decision Making This patient presents to the ED with chief complaint(s) of nosebleed  with pertinent past medical history of hypertension, PVD on aspirin which further complicates the presenting complaint. The complaint involves an extensive differential diagnosis and also carries with it a high risk of complications and morbidity.    The differential diagnosis includes anemia, coagulopathy, anterior epistaxis, posterior epistaxis  Additional history obtained: Additional history obtained from N/A Records reviewed Care Everywhere/External Records  ED Course and Reassessment: On patient's arrival she is tachycardic and otherwise hemodynamically stable in no acute distress.  She did have Afrin applied by RN on her arrival, she has no active bleeding at this time.  With her tachycardia will have labs to evaluate for anemia or coagulopathy, given Zofran  for nausea.  Patient will be observed for any recurrence of bleeding.  Suspect the bleeding is from dry air from the heaters running as it has been very cold overnight.  Patient also reports that she does take atenolol as needed for tachycardia and states that she has not taken it yet today which may be driving some of her tachycardia.  She will be closely reassessed.  Independent labs interpretation:  The following labs were independently interpreted: within normal range  Independent visualization of imaging: - N/A  Consultation: - Consulted or discussed management/test interpretation w/ external professional: N/A  Consideration for admission or further workup: Patient has no emergent conditions requiring admission or further work-up at this time and is stable for discharge home with primary care follow-up  Social Determinants of health: N/A    Amount and/or Complexity of Data  Reviewed Labs: ordered.  Risk OTC drugs. Prescription drug management.       Final diagnoses:  Epistaxis  Sinus tachycardia    ED Discharge Orders          Ordered    sodium chloride (OCEAN) 0.65 % SOLN nasal spray  As needed        11/07/24 1109               Kingsley, Harvie Morua K, DO 11/07/24 1111  "

## 2024-11-07 NOTE — ED Notes (Signed)
 Resting comfortably. No obvious bleeding. Denies complaints at this time.
